# Patient Record
Sex: Female | Born: 2000
Health system: Southern US, Community
[De-identification: ages and names within clinical notes are randomized; demographics above are authoritative.]

---
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
## 2022-08-22 IMAGING — CR DG FOOT COMPLETE 3+V*L*
3 series · 3 of 3 positions shown · non-contrast
Comparison: None.

CLINICAL DATA: Pain

EXAM:
LEFT FOOT - COMPLETE 3+ VIEW

[foot ap]
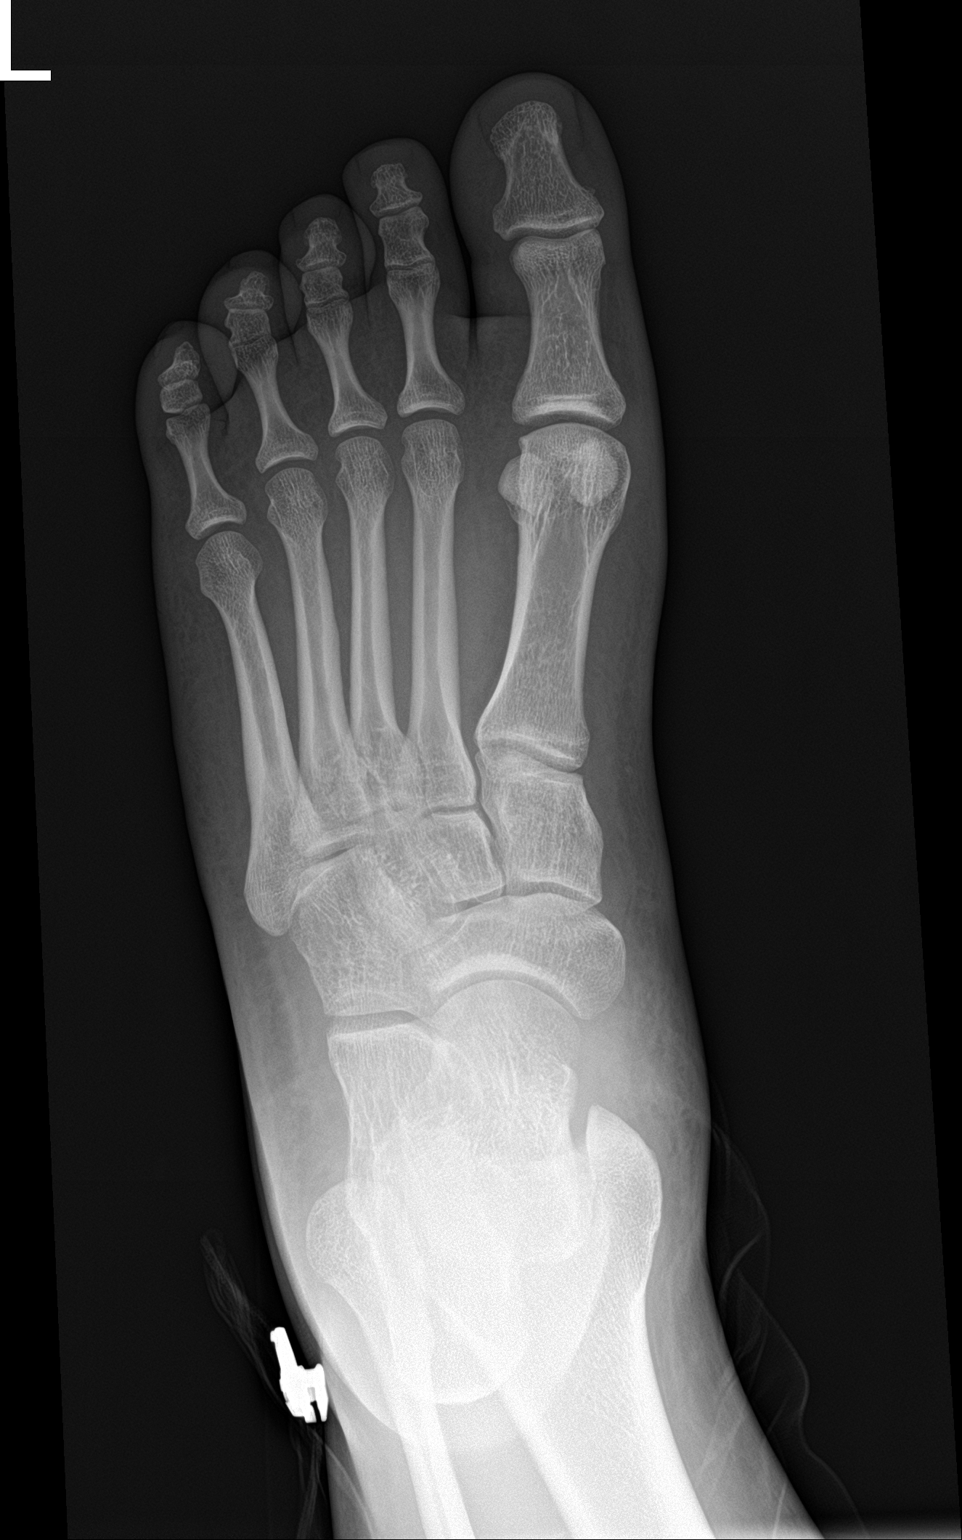

[foot obl]
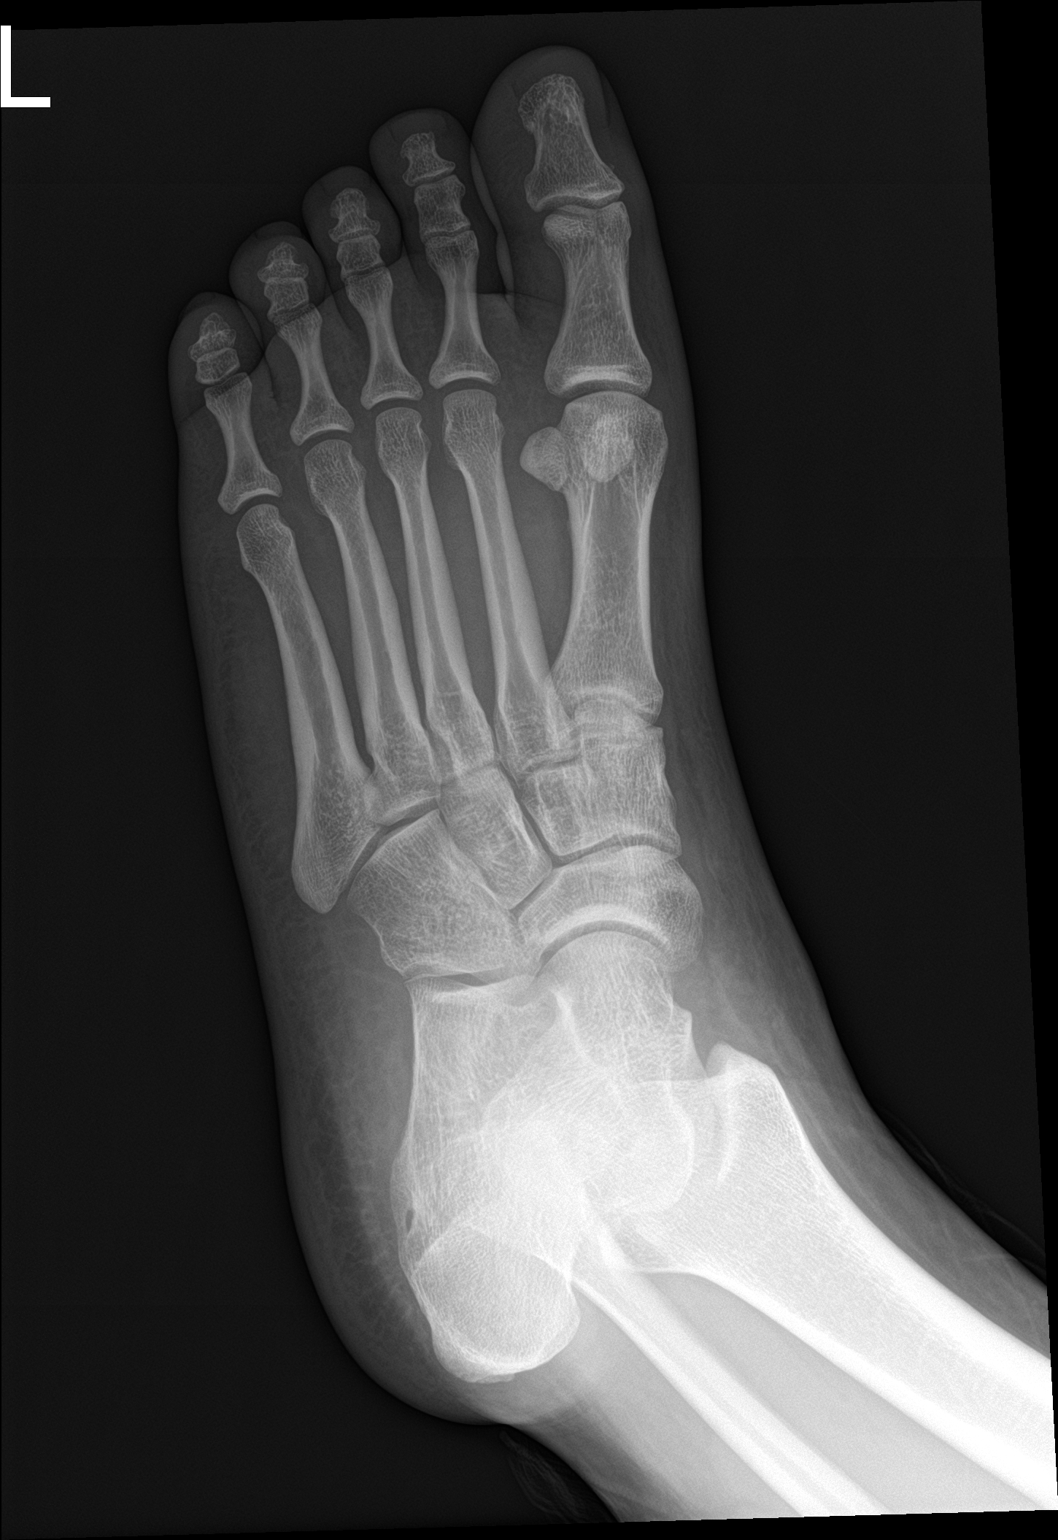

[foot lat]
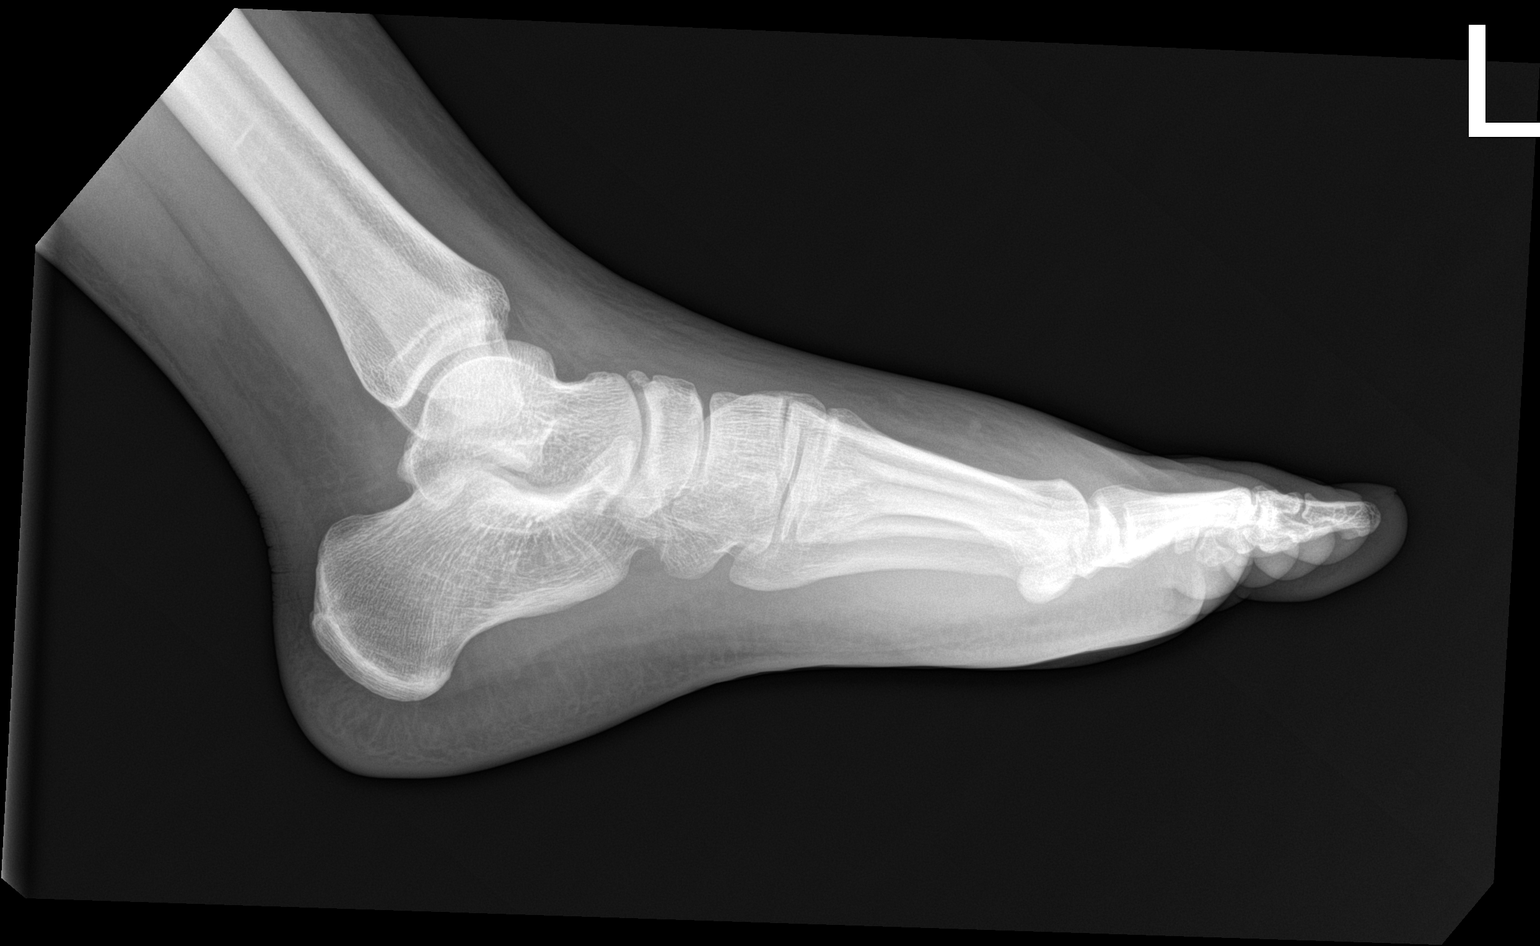

[3 of 3 positions shown; findings below may reference images not displayed]

FINDINGS: There is no evidence of fracture or dislocation. There is no
evidence of arthropathy or other focal bone abnormality. Dorsal soft
tissue swelling is seen.
IMPRESSION: No acute osseous abnormality.  Diffuse dorsal soft tissue swelling.

## 2022-08-22 IMAGING — CR DG HAND COMPLETE 3+V*L*
3 series · 3 of 3 positions shown · non-contrast
Comparison: None.

CLINICAL DATA: Swelling and pain

EXAM:
LEFT HAND - COMPLETE 3+ VIEW

[hand pa]
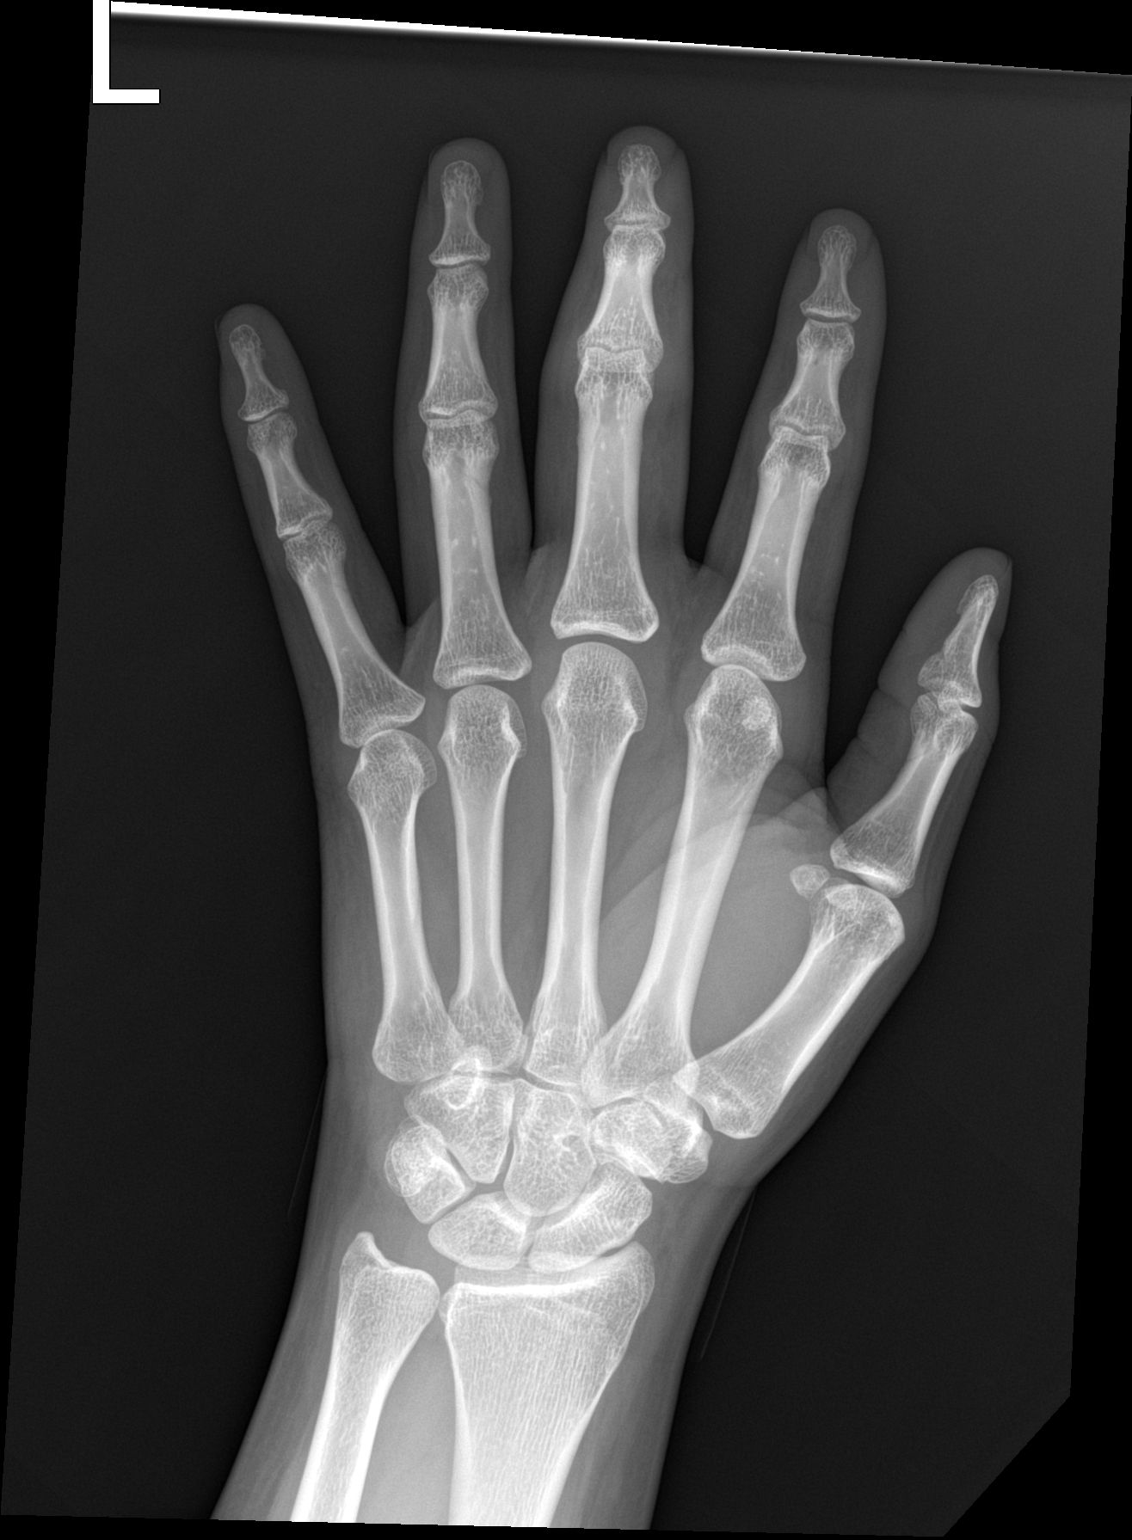

[hand obl]
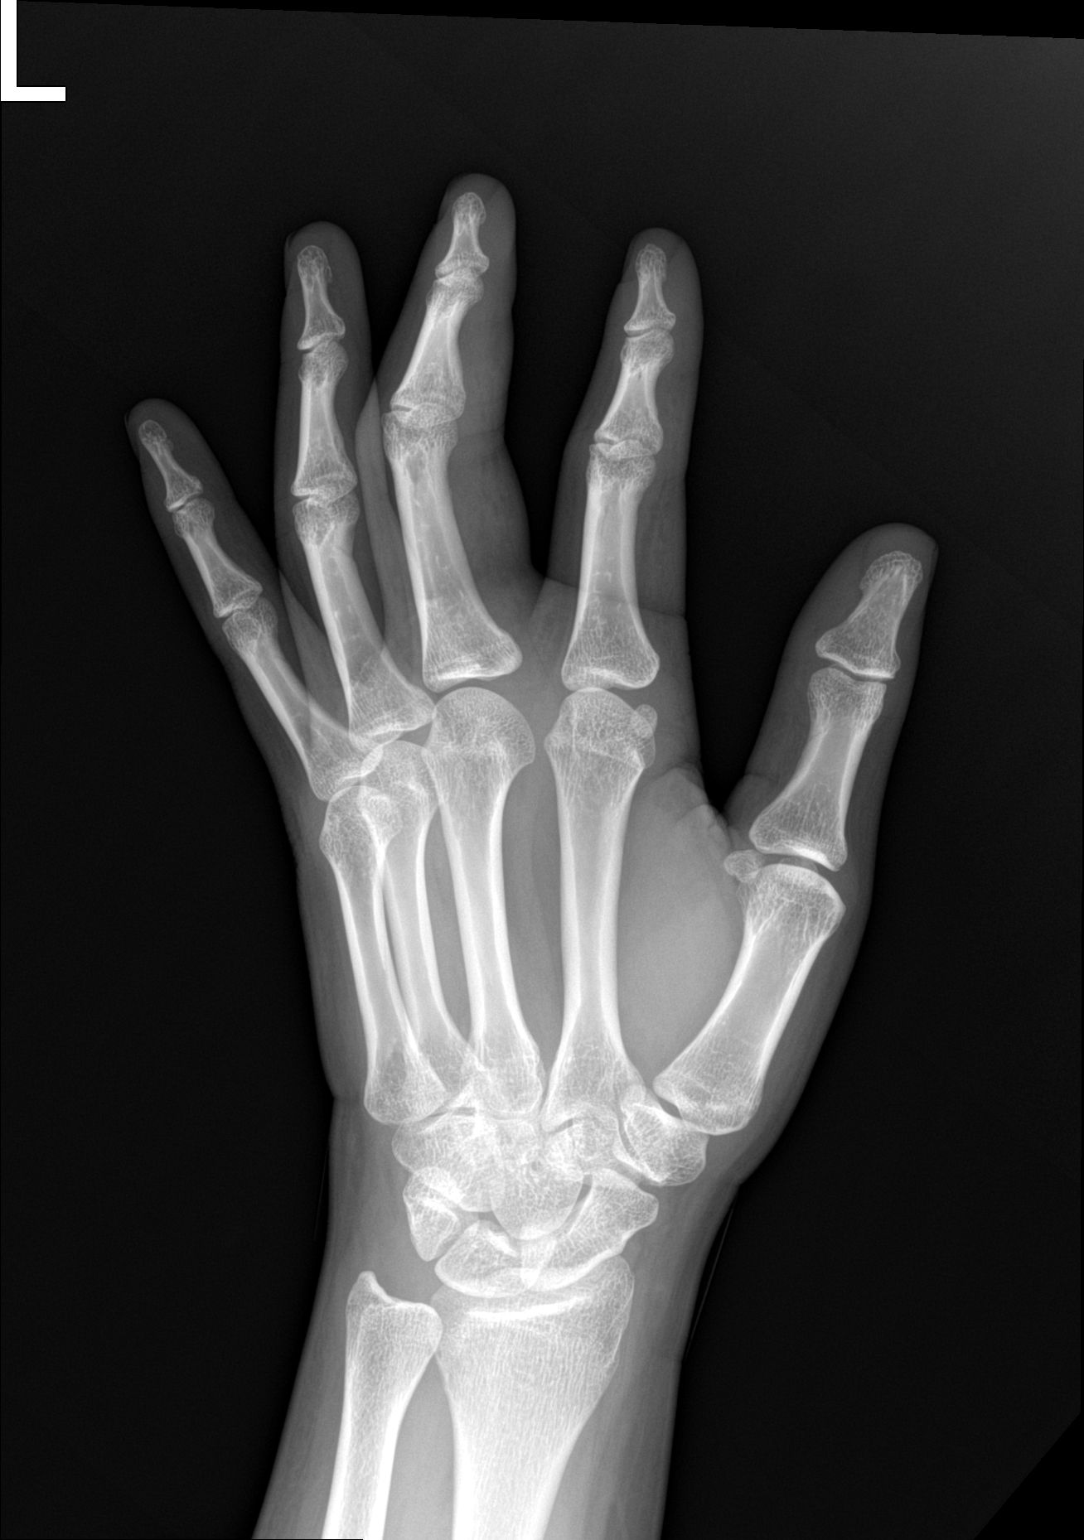

[hand lat]
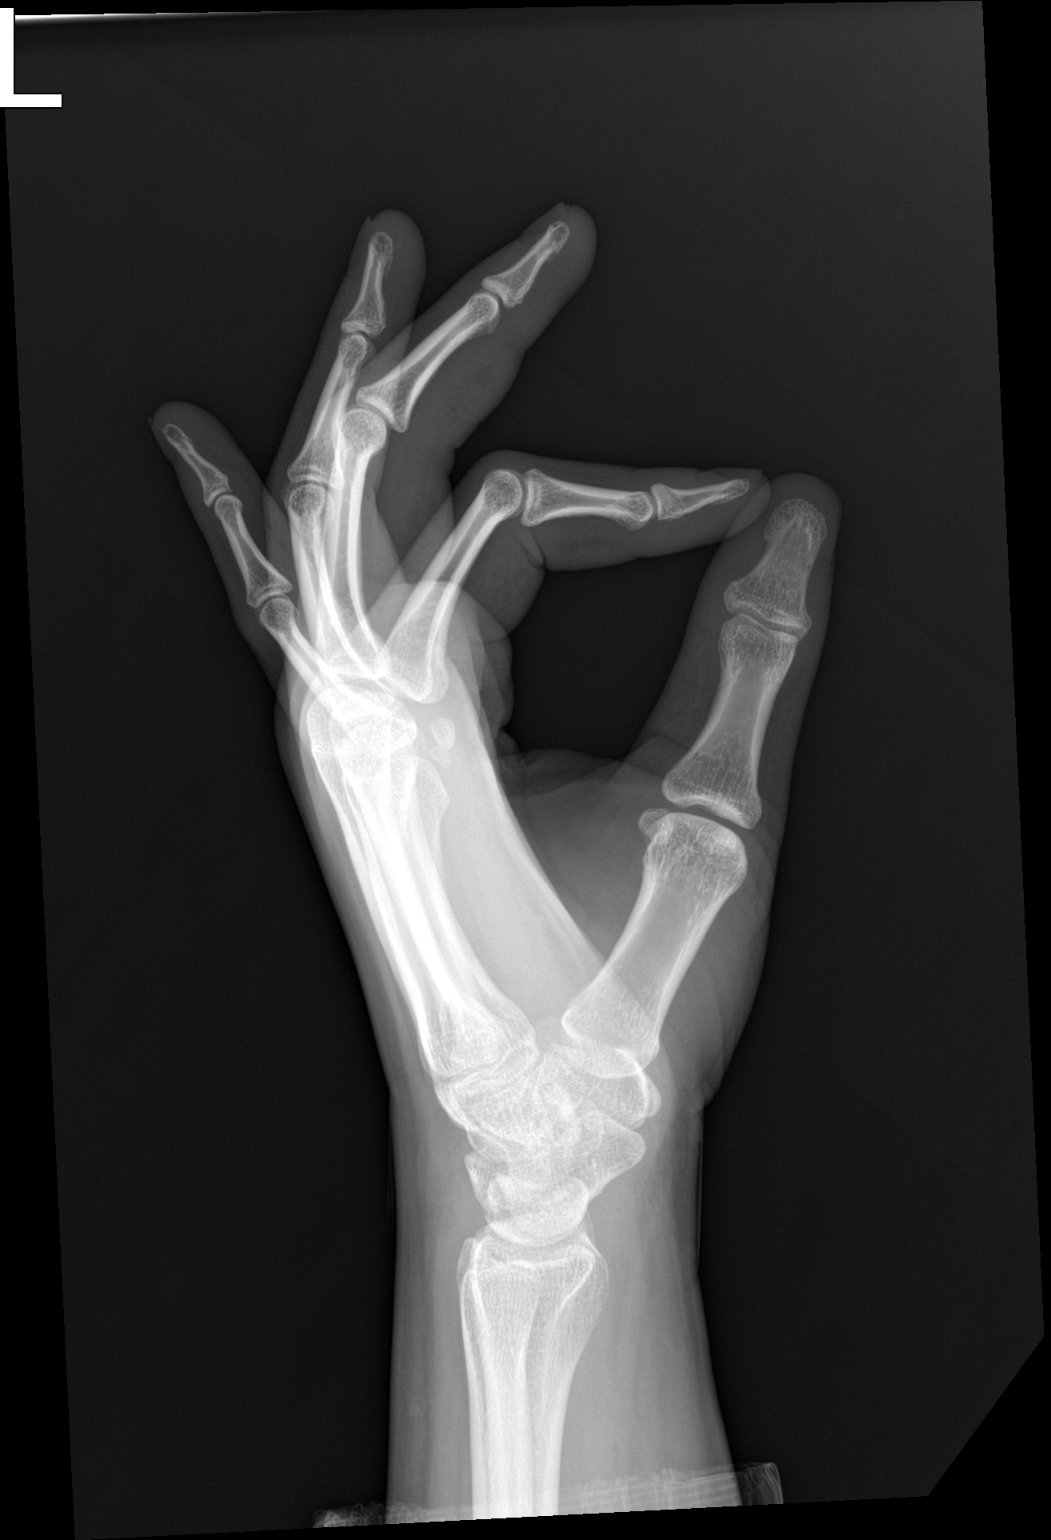

[3 of 3 positions shown; findings below may reference images not displayed]

FINDINGS: There is no evidence of fracture or dislocation. There is no
evidence of arthropathy or other focal bone abnormality. Soft tissue
swelling is seen around the metacarpals, most notable around the
third digit.
IMPRESSION: Soft tissue swelling around the metacarpals, most notable of the
third digit.

## 2024-01-19 ENCOUNTER — Ambulatory Visit (HOSPITAL_COMMUNITY): Payer: Self-pay
